# Patient Record
Sex: Female | Born: 1980 | Race: White | Hispanic: No | Marital: Married | Smoking: Never smoker
Health system: Southern US, Community
[De-identification: ages and names within clinical notes are randomized; demographics above are authoritative.]

## PROBLEM LIST (undated history)

## (undated) HISTORY — PX: NO PAST SURGERIES: SHX2092

---

## 2005-01-03 ENCOUNTER — Emergency Department: Payer: Self-pay | Admitting: Unknown Physician Specialty

## 2019-11-01 ENCOUNTER — Ambulatory Visit
Admission: EM | Admit: 2019-11-01 | Discharge: 2019-11-01 | Disposition: A | Discharge status: Home / Self Care | Payer: BC Managed Care – PPO | Attending: Emergency Medicine | Admitting: Emergency Medicine

## 2019-11-01 ENCOUNTER — Other Ambulatory Visit: Payer: Self-pay

## 2019-11-01 ENCOUNTER — Encounter: Payer: Self-pay | Admitting: Emergency Medicine

## 2019-11-01 DIAGNOSIS — J069 Acute upper respiratory infection, unspecified: Secondary | ICD-10-CM | POA: Diagnosis present

## 2019-11-01 DIAGNOSIS — U071 COVID-19: Secondary | ICD-10-CM | POA: Diagnosis not present

## 2019-11-01 DIAGNOSIS — J309 Allergic rhinitis, unspecified: Secondary | ICD-10-CM

## 2019-11-01 LAB — SARS CORONAVIRUS 2 (TAT 6-24 HRS): SARS Coronavirus 2: POSITIVE — AB

## 2019-11-01 NOTE — ED Triage Notes (Signed)
Pt c/o nasal congestion, nausea, cough, subjective fever, sweats. Started about 5 days ago. She has not had covid vaccine.

## 2019-11-01 NOTE — Discharge Instructions (Addendum)
Quarantine until the results of your COVID test are back. If the results are positive then you will need to continue quarantine for 10 days from the start of your symptoms. After 10 days you can break quarantine if your symptoms have improved and you have not had to take medication for fever for 24 hours.  Continue your Allegra and add Mucinex to help break up congestion.  Irrigate your sinuses with a Neilmed sinus rinse kit and distilled water 2-3 times a day.   Hone-based cough syrups are very helpful for relieving cough.  You can use Tylenol or Ibuprofen as needed for fever and pain.  Go to the ER if you develop shortness of breath at rest or if you are unable to take in and keep down fluids.

## 2019-11-01 NOTE — ED Provider Notes (Signed)
[facility]    CSN: [id number] Arrival date & time: 11/01/19  0806      History   Chief Complaint Chief Complaint  Patient presents with   Nasal Congestion    HPI Heidi Harper is a 39 y.o. female.   39 yo female here for evaluation of nasal congestion, cough, ST, subjective fever, sweats, nausea, sibnus pressure and discharge. Symptioms started 5 days ago. ' She is not a smoker, denies know COVID exposure, and is unvaccinated.      History reviewed. No pertinent past medical history.  There are no problems to display for this patient.   Past Surgical History:  Procedure Laterality Date   NO PAST SURGERIES      OB History   No obstetric history on file.      Home Medications    Prior to Admission medications   Medication Sig Start Date End Date Taking? Authorizing Provider  fexofenadine (ALLEGRA) 180 MG tablet Take 180 mg by mouth daily.   Yes [provider]    Family History Family History  Problem Relation Age of Onset   Diabetes Mother    Diabetes Father    Heart disease Father     Social History Social History   Tobacco Use   Smoking status: Never Smoker   Smokeless tobacco: Never Used  Scientific laboratory technician Use: Never used  Substance Use Topics   Alcohol use: Yes   Drug use: Not Currently     Allergies   Patient has no known allergies.   Review of Systems Review of Systems  Constitutional: Positive for diaphoresis and fever. Negative for activity change and appetite change.  HENT: Positive for congestion, ear pain, rhinorrhea, sinus pressure, sinus pain and sore throat. Negative for ear discharge.   Respiratory: Positive for cough. Negative for shortness of breath and wheezing.   Cardiovascular: Negative for chest pain.  Gastrointestinal: Positive for nausea. Negative for diarrhea and vomiting.  Genitourinary: Negative for dysuria and frequency.  Musculoskeletal: Negative for arthralgias and  myalgias.  Skin: Negative.   Allergic/Immunologic: Positive for environmental allergies.  Neurological: Negative for syncope and headaches.  Hematological: Negative.   Psychiatric/Behavioral: Negative.      Physical Exam Triage Vital Signs ED Triage Vitals  Enc Vitals Group     BP 11/01/19 0853 105/82     Pulse Rate 11/01/19 0853 (!) 116     Resp 11/01/19 0853 18     Temp 11/01/19 0853 98.5 F (36.9 C)     Temp Source 11/01/19 0853 Oral     SpO2 11/01/19 0853 98 %     Weight 11/01/19 0849 142 lb (64.4 kg)     Height 11/01/19 0849 5' 5" (1.651 m)     Head Circumference --      Peak Flow --      Pain Score 11/01/19 0849 8     Pain Loc --      Pain Edu? --      Excl. in [place]? --    No data found.  Updated Vital Signs BP 105/82 (BP Location: Left Arm)    Pulse (!) 116    Temp 98.5 F (36.9 C) (Oral)    Resp 18    Ht 5' 5" (1.651 m)    Wt 142 lb (64.4 kg)    LMP 10/16/2019 (Approximate)    SpO2 98%    BMI 23.63 kg/m   Visual Acuity Right Eye Distance:   Left  Eye Distance:   Bilateral Distance:    Right Eye Near:   Left Eye Near:    Bilateral Near:     Physical Exam Vitals and nursing note reviewed.  Constitutional:      Appearance: Normal appearance.  HENT:     Head: Normocephalic and atraumatic.     Right Ear: Tympanic membrane, ear canal and external ear normal.     Left Ear: Tympanic membrane, ear canal and external ear normal.     Ears:     Comments: Mild cerumen in both ears but TM is clearly visible, pearly gray.     Nose: Congestion and rhinorrhea present.     Comments: Nasal mucosa is pale, swollen, with scant clear discharge.   Right frontal and maxillary sinuses are tender to percussion.     Mouth/Throat:     Mouth: Mucous membranes are moist.     Pharynx: No oropharyngeal exudate or posterior oropharyngeal erythema.  Eyes:     Extraocular Movements: Extraocular movements intact.     Conjunctiva/sclera: Conjunctivae normal.     Pupils: Pupils are  equal, round, and reactive to light.  Neck:     Comments: Shotty anterior cervical lymphadenopathy Cardiovascular:     Rate and Rhythm: Normal rate and regular rhythm.     Pulses: Normal pulses.     Heart sounds: Normal heart sounds.  Pulmonary:     Effort: Pulmonary effort is normal.     Breath sounds: Normal breath sounds.  Musculoskeletal:        General: Normal range of motion.     Cervical back: Normal range of motion and neck supple.  Lymphadenopathy:     Cervical: Cervical adenopathy present.  Skin:    General: Skin is warm and dry.     Capillary Refill: Capillary refill takes less than 2 seconds.  Neurological:     General: No focal deficit present.     Mental Status: She is alert.  Psychiatric:        Mood and Affect: Mood normal.        Behavior: Behavior normal.        Thought Content: Thought content normal.        Judgment: Judgment normal.      UC Treatments / Results  Labs (all labs ordered are listed, but only abnormal results are displayed) Labs Reviewed  SARS CORONAVIRUS 2 (TAT 6-24 HRS)    EKG   Radiology No results found.  Procedures Procedures (including critical care time)  Medications Ordered in UC Medications - No data to display  Initial Impression / Assessment and Plan / UC Course  I have reviewed the triage vital signs and the nursing notes.  Pertinent labs & imaging results that were available during my care of the patient were reviewed by me and considered in my medical decision making (see chart for details).   Patient has had 5 days of subjective fever, nasal congestion and clear-yellow nasal discharge. She had ear pressure and ST but those have resolved.  She has a history of allergies but had been out of her medication when symptoms started. She typically takes Human resources officer daily.   PE shows pale, swollen nasal mucosa with clear discharge, no PND or posterior oropharyngeal edema or erythema. Tonsillar pillars are pink and moist  without exudate. Lymphnodes are swollen and mildly tender.  Will check for COVID given unvaccinated status. PE findings more consistent with allergic rhinitis and sinus congestion. Will treat conservatively with fluids, sinus rinse,  continued antihistamines, and mucinex and have quarantine pending COVID results.    Final Clinical Impressions(s) / UC Diagnoses   Final diagnoses:  Allergic rhinitis, unspecified seasonality, unspecified trigger  Upper respiratory tract infection, unspecified type     Discharge Instructions     Quarantine until the results of your COVID test are back. If the results are positive then you will need to continue quarantine for 10 days from the start of your symptoms. After 10 days you can break quarantine if your symptoms have improved and you have not had to take medication for fever for 24 hours.  Continue your Allegra and add Mucinex to help break up congestion.  Irrigate your sinuses with a Neilmed sinus rinse kit and distilled water 2-3 times a day.   Hone-based cough syrups are very helpful for relieving cough.  You can use Tylenol or Ibuprofen as needed for fever and pain.  Go to the ER if you develop shortness of breath at rest or if you are unable to take in and keep down fluids.     ED Prescriptions    None     PDMP not reviewed this encounter.   Margarette Canada, NP 11/01/19 [phone]

## 2020-02-20 ENCOUNTER — Emergency Department: Payer: BC Managed Care – PPO

## 2020-02-20 ENCOUNTER — Other Ambulatory Visit: Payer: Self-pay

## 2020-02-20 ENCOUNTER — Emergency Department
Admission: EM | Admit: 2020-02-20 | Discharge: 2020-02-20 | Disposition: A | Discharge status: Home / Self Care | Payer: BC Managed Care – PPO | Attending: Emergency Medicine | Admitting: Emergency Medicine

## 2020-02-20 DIAGNOSIS — R0789 Other chest pain: Secondary | ICD-10-CM

## 2020-02-20 LAB — CBC
HCT: 30.4 % — ABNORMAL LOW (ref 36.0–46.0)
Hemoglobin: 9 g/dL — ABNORMAL LOW (ref 12.0–15.0)
MCH: 19.8 pg — ABNORMAL LOW (ref 26.0–34.0)
MCHC: 29.6 g/dL — ABNORMAL LOW (ref 30.0–36.0)
MCV: 66.8 fL — ABNORMAL LOW (ref 80.0–100.0)
Platelets: 442 10*3/uL — ABNORMAL HIGH (ref 150–400)
RBC: 4.55 MIL/uL (ref 3.87–5.11)
RDW: 16.6 % — ABNORMAL HIGH (ref 11.5–15.5)
WBC: 13 10*3/uL — ABNORMAL HIGH (ref 4.0–10.5)
nRBC: 0 % (ref 0.0–0.2)

## 2020-02-20 LAB — BASIC METABOLIC PANEL
Anion gap: 10 (ref 5–15)
BUN: 16 mg/dL (ref 6–20)
CO2: 28 mmol/L (ref 22–32)
Calcium: 9.5 mg/dL (ref 8.9–10.3)
Chloride: 102 mmol/L (ref 98–111)
Creatinine, Ser: 0.69 mg/dL (ref 0.44–1.00)
GFR, Estimated: >60 mL/min (ref >60)
Glucose, Bld: 111 mg/dL — ABNORMAL HIGH (ref 70–99)
Potassium: 3.8 mmol/L (ref 3.5–5.1)
Sodium: 140 mmol/L (ref 135–145)

## 2020-02-20 LAB — TROPONIN I (HIGH SENSITIVITY): Troponin I (High Sensitivity): <2 ng/L (ref <18)

## 2020-02-20 MED ORDER — METHOCARBAMOL 500 MG PO TABS: 500.0000 mg | ORAL_TABLET | Freq: Every evening | ORAL | 0 refills | Status: DC | PRN

## 2020-02-20 MED ORDER — MELOXICAM 7.5 MG PO TABS
15.0000 mg | ORAL_TABLET | Freq: Once | ORAL | Status: AC
  Administered 2020-02-20: 15 mg via ORAL
  Filled 2020-02-20: qty 2

## 2020-02-20 MED ORDER — MELOXICAM 15 MG PO TABS: 15.0000 mg | ORAL_TABLET | Freq: Every day | ORAL | 0 refills | Status: DC

## 2020-02-20 NOTE — ED Provider Notes (Signed)
[facility] Emergency Department Provider Note  ____________________________________________  Time seen: Approximately 9:20 PM  I have reviewed the triage vital signs and the nursing notes.   HISTORY  Chief Complaint Chest Pain    HPI Heidi Harper is a 40 y.o. female who presents emergency department complaining of left chest/rib pain.  Patient states that she slipped coming down some stairs last night, had an awkward twisting motion to catch something to keep her from falling.  She did not land on the ribs and did not have any pain immediately following this event but states that today she developed some left chest/rib pain.  Has no cardiac history, denies any shortness of breath.  No other symptoms.  Patient states that it is a pulling/vibrating/tingling/sharp sensation when it occurs.  This pain is primarily localized in the left lateral rib cage         History reviewed. No pertinent past medical history.  There are no problems to display for this patient.   Past Surgical History:  Procedure Laterality Date  . NO PAST SURGERIES      Prior to Admission medications   Medication Sig Start Date End Date Taking? Authorizing Provider  meloxicam (MOBIC) 15 MG tablet Take 1 tablet (15 mg total) by mouth daily. 02/20/20  Yes Nicole Defino, Delorise Royals, PA-C  methocarbamol (ROBAXIN) 500 MG tablet Take 1 tablet (500 mg total) by mouth at bedtime as needed for muscle spasms. 02/20/20  Yes Abreanna Drawdy, Delorise Royals, PA-C  fexofenadine (ALLEGRA) 180 MG tablet Take 180 mg by mouth daily.    [provider]    Allergies Patient has no known allergies.  Family History  Problem Relation Age of Onset  . Diabetes Mother   . Diabetes Father   . Heart disease Father     Social History Social History   Tobacco Use  . Smoking status: Never Smoker  . Smokeless tobacco: Never Used  Vaping Use  . Vaping Use: Never used  Substance Use Topics  . Alcohol use:  Yes  . Drug use: Not Currently     Review of Systems  Constitutional: No fever/chills Eyes: No visual changes. No discharge ENT: No upper respiratory complaints. Cardiovascular: Left chest/rib pain. Respiratory: no cough. No SOB. Gastrointestinal: No abdominal pain.  No nausea, no vomiting.  No diarrhea.  No constipation. Musculoskeletal: Left lateral rib pain Skin: Negative for rash, abrasions, lacerations, ecchymosis. Neurological: Negative for headaches, focal weakness or numbness.  10 System ROS otherwise negative.  ____________________________________________   PHYSICAL EXAM:  VITAL SIGNS: ED Triage Vitals  Enc Vitals Group     BP 02/20/20 1742 130/72     Pulse Rate 02/20/20 1742 99     Resp 02/20/20 1742 18     Temp 02/20/20 1742 98.4 F (36.9 C)     Temp Source 02/20/20 1742 Oral     SpO2 02/20/20 1742 100 %     Weight 02/20/20 1742 152 lb (68.9 kg)     Height 02/20/20 1742 5\' 5"  (1.651 m)     Head Circumference --      Peak Flow --      Pain Score 02/20/20 1841 0     Pain Loc --      Pain Edu? --      Excl. in [place]? --      Constitutional: Alert and oriented. Well appearing and in no acute distress. Eyes: Conjunctivae are normal. PERRL. EOMI. Head: Atraumatic. ENT:  Ears:       Nose: No congestion/rhinnorhea.      Mouth/Throat: Mucous membranes are moist.  Neck: No stridor.    Cardiovascular: Normal rate, regular rhythm. Normal S1 and S2.  Good peripheral circulation. Respiratory: Normal respiratory effort without tachypnea or retractions. Lungs CTAB. Good air entry to the bases with no decreased or absent breath sounds. Gastrointestinal: Bowel sounds 4 quadrants. Soft and nontender to palpation. No guarding or rigidity. No palpable masses. No distention.  Musculoskeletal: Full range of motion to all extremities. No gross deformities appreciated.  Visualization of the left rib cage reveals no visible signs of trauma.  Patient is slightly tender  diffusely along the lateral rib cage without point specific tenderness.  No palpable abnormality or crepitus.  Good underlying breath sounds bilaterally. Neurologic:  Normal speech and language. No gross focal neurologic deficits are appreciated.  Skin:  Skin is warm, dry and intact. No rash noted. Psychiatric: Mood and affect are normal. Speech and behavior are normal. Patient exhibits appropriate insight and judgement.   ____________________________________________   LABS (all labs ordered are listed, but only abnormal results are displayed)  Labs Reviewed  BASIC METABOLIC PANEL - Abnormal; Notable for the following components:      Result Value   Glucose, Bld 111 (*)    All other components within normal limits  CBC - Abnormal; Notable for the following components:   WBC 13.0 (*)    Hemoglobin 9.0 (*)    HCT 30.4 (*)    MCV 66.8 (*)    MCH 19.8 (*)    MCHC 29.6 (*)    RDW 16.6 (*)    Platelets 442 (*)    All other components within normal limits  POC URINE PREG, ED  TROPONIN I (HIGH SENSITIVITY)  TROPONIN I (HIGH SENSITIVITY)   ____________________________________________  EKG  ED ECG REPORT I, Delorise Royals Jaycelynn Knickerbocker,  personally viewed and interpreted this ECG.   Date: 02/20/2020  EKG Time: 1745 hrs.  Rate: 87 bpm  Rhythm: there are no previous tracings available for comparison, normal sinus rhythm  Axis: Normal axis  Intervals:none  ST&T Change: No ST elevation or depression noted  No STEMI.  Normal sinus rhythm.  No previous EKGs on record.  ____________________________________________  RADIOLOGY I personally viewed and evaluated these images as part of my medical decision making, as well as reviewing the written report by the radiologist.  ED Provider Interpretation: No evidence of rib fracture or underlying cardiopulmonary abnormality on chest x-ray  DG Chest 2 View  Result Date: 02/20/2020 CLINICAL DATA:  Left-sided chest pain following fall yesterday,  initial encounter EXAM: CHEST - 2 VIEW COMPARISON:  None. FINDINGS: The heart size and mediastinal contours are within normal limits. Both lungs are clear. The visualized skeletal structures are unremarkable. IMPRESSION: No active cardiopulmonary disease. Electronically Signed   By: Alcide Clever M.D.   On: 02/20/2020 19:41    ____________________________________________    PROCEDURES  Procedure(s) performed:    Procedures    Medications - No data to display   ____________________________________________   INITIAL IMPRESSION / ASSESSMENT AND PLAN / ED COURSE  Pertinent labs & imaging results that were available during my care of the patient were reviewed by me and considered in my medical decision making (see chart for details).  Review of the [place] CSRS was performed in accordance of the [facility] prior to dispensing any controlled drugs.           Patient's diagnosis is consistent with  chest wall pain.  Patient presented to the emergency department complaining of left sided rib/chest pain.  Patient had a fall yesterday, reached out to grab a railing so that she did not further injure herself.  She did not land on the ribs and developed left-sided chest wall pain earlier today.  There was no evident sign of trauma on physical exam.  Work-up is reassuring with no evidence of rib fracture, labs are reassuring.  No evidence that this is cardiac chest pain.  Differential included ACS/STEMI, pneumothorax, rib contusion, rib fracture.  Likely musculoskeletal strain versus costochondral pain.  Patient was placed on anti-inflammatory muscle relaxer for symptom relief..  Follow-up primary care as needed.  Patient is given ED precautions to return to the ED for any worsening or new symptoms.     ____________________________________________  FINAL CLINICAL IMPRESSION(S) / ED DIAGNOSES  Final diagnoses:  Chest wall pain      NEW MEDICATIONS STARTED DURING THIS VISIT:  ED Discharge  Orders         Ordered    meloxicam (MOBIC) 15 MG tablet  Daily        02/20/20 2149    methocarbamol (ROBAXIN) 500 MG tablet  At bedtime PRN        02/20/20 2149              This chart was dictated using voice recognition software/Dragon. Despite best efforts to proofread, errors can occur which can change the meaning. Any change was purely unintentional.    Racheal Patches, PA-C 02/20/20 2151    Sharyn Creamer, MD 02/20/20 2348

## 2020-02-20 NOTE — ED Triage Notes (Signed)
Pt arrives to ER c/o of L sided CP that wraps around pt breast. States she slipped off porch last night. Denies hitting chest but states "I might have pulled something." A&O, ambulatory. No distress noted.

## 2020-03-11 NOTE — Progress Notes (Deleted)
    New patient visit   Patient: Heidi Harper   DOB: 11/30/1980   40 y.o. Female  MRN: [id number] Visit Date: 03/12/2020  Today's healthcare provider: Jairo Ben, FNP   No chief complaint on file.  Subjective    Heidi Harper is a 40 y.o. female who presents today as a new patient to establish care.  HPI    No past medical history on file. Past Surgical History:  Procedure Laterality Date  . NO PAST SURGERIES     Family Status  Relation Name Status  . Mother  Alive  . Father  Alive   Family History  Problem Relation Age of Onset  . Diabetes Mother   . Diabetes Father   . Heart disease Father    Social History   Socioeconomic History  . Marital status: Married    Spouse name: Not on file  . Number of children: Not on file  . Years of education: Not on file  . Highest education level: Not on file  Occupational History  . Not on file  Tobacco Use  . Smoking status: Never Smoker  . Smokeless tobacco: Never Used  Vaping Use  . Vaping Use: Never used  Substance and Sexual Activity  . Alcohol use: Yes  . Drug use: Not Currently  . Sexual activity: Not on file  Other Topics Concern  . Not on file  Social History Narrative  . Not on file   Social Determinants of Health   Financial Resource Strain: Not on file  Food Insecurity: Not on file  Transportation Needs: Not on file  Physical Activity: Not on file  Stress: Not on file  Social Connections: Not on file   Outpatient Medications Prior to Visit  Medication Sig  . fexofenadine (ALLEGRA) 180 MG tablet Take 180 mg by mouth daily.  . meloxicam (MOBIC) 15 MG tablet Take 1 tablet (15 mg total) by mouth daily.  . methocarbamol (ROBAXIN) 500 MG tablet Take 1 tablet (500 mg total) by mouth at bedtime as needed for muscle spasms.   No facility-administered medications prior to visit.   No Known Allergies   There is no immunization history on file for this patient.  Health Maintenance   Topic Date Due  . Hepatitis C Screening  Never done  . COVID-19 Vaccine (1) Never done  . HIV Screening  Never done  . TETANUS/TDAP  Never done  . PAP SMEAR-Modifier  Never done  . INFLUENZA VACCINE  Never done    Patient Care Team: Pcp, No as PCP - General  Review of Systems  {Labs  Heme  Chem  Endocrine  Serology  Results Review (optional):[id number]::" "}  Objective    There were no vitals taken for this visit. Physical Exam ***  Depression Screen No flowsheet data found. No results found for any visits on 03/12/20.  Assessment & Plan     ***  No follow-ups on file.     {provider attestation***:1}   Jairo Ben, FNP  [facility] [phone] (phone) [phone] (fax)  [facility]

## 2020-03-12 ENCOUNTER — Ambulatory Visit: Payer: Self-pay | Admitting: Adult Health

## 2020-03-12 DIAGNOSIS — Z1389 Encounter for screening for other disorder: Secondary | ICD-10-CM

## 2020-04-17 ENCOUNTER — Other Ambulatory Visit: Payer: Self-pay

## 2020-04-17 ENCOUNTER — Ambulatory Visit (INDEPENDENT_AMBULATORY_CARE_PROVIDER_SITE_OTHER): Payer: BC Managed Care – PPO | Admitting: Advanced Practice Midwife

## 2020-04-17 ENCOUNTER — Encounter: Payer: Self-pay | Admitting: Advanced Practice Midwife

## 2020-04-17 VITALS — BP 120/80 | Ht 65.0 in | Wt 155.0 lb

## 2020-04-17 DIAGNOSIS — N924 Excessive bleeding in the premenopausal period: Secondary | ICD-10-CM

## 2020-04-17 NOTE — Patient Instructions (Signed)
Menorrhagia Menorrhagia is a form of abnormal uterine bleeding in which menstrual periods are heavy or last longer than normal. With menorrhagia, the periods may cause enough blood loss and cramping that a woman becomes unable to take part in her usual activities. What are the causes? Common causes of this condition include:  Polyps or fibroids. These are noncancerous growths in the uterus.  An imbalance of the hormones estrogen and progesterone.  Anovulation, which occurs when one of the ovaries does not release an egg during one or more months.  A problem with the thyroid gland (hypothyroidism).  Side effects of having an intrauterine device (IUD).  Side effects of some medicines, such as NSAIDs or blood thinners.  A bleeding disorder that stops the blood from clotting normally. In some cases, the cause of this condition is not known. What increases the risk? You are more likely to develop this condition if you have cancer of the uterus. What are the signs or symptoms? Symptoms of this condition include:  Routinely having to change your pad or tampon every 1-2 hours because it is soaked.  Needing to use pads and tampons at the same time because of heavy bleeding.  Needing to wake up to change your pads or tampons during the night.  Passing blood clots larger than 1 inch (2.5 cm) in size.  Having bleeding that lasts for more than 7 days.  Having symptoms of low iron levels (anemia), such as tiredness (fatigue) or shortness of breath. How is this diagnosed? This condition may be diagnosed based on:  A physical exam.  Your symptoms and menstrual history.  Tests, such as: ? Blood tests to check if you are pregnant or if you have hormonal changes, a bleeding or thyroid disorder, anemia, or other problems. ? Pap test to check for cancerous changes, infections, or inflammation. ? Endometrial biopsy. This test involves removing a tissue sample from the lining of the uterus  (endometrium) to be examined under a microscope. ? Pelvic ultrasound. This test uses sound waves to create images of your uterus, ovaries, and vagina. The images can show if you have fibroids or other growths. ? Hysteroscopy. For this test, a thin, flexible tube with a light on the end (hysteroscope) is used to look inside your uterus. How is this treated? Treatment may not be needed for this condition. If it is needed, the best treatment for you will depend on:  Whether you need to prevent pregnancy.  Your desire to have children in the future.  The cause and severity of your bleeding.  Your personal preference. Medicine Medicines are the first step in treatment. You may be treated with:  Hormonal birth control methods. These treatments reduce bleeding during your menstrual period. They include: ? Birth control pills. ? Skin patch. ? Vaginal ring. ? Shots (injections) that you get every 3 months. ? Hormonal IUD. ? Implants that go under the skin.  Medicines that thicken the blood and slow bleeding.  Medicines that reduce swelling, such as ibuprofen.  Medicines that contain an artificial (synthetic) hormone called progestin.  Medicines that make the ovaries stop working for a short time.  Iron supplements to treat anemia.   Surgery If medicines do not work, surgery may be done. Surgical options may include:  Dilation and curettage (D&C). In this procedure, your health care provider opens the lowest part of the uterus (cervix) and then scrapes or suctions tissue from the endometrium. This reduces menstrual bleeding.  Operative hysteroscopy. In this procedure,   a hysteroscope is used to view your uterus and help remove polyps that may be causing heavy periods.  Endometrial ablation. This is when various techniques are used to permanently destroy your entire endometrium. After endometrial ablation, most women have little or no menstrual flow. This procedure reduces your ability to  become pregnant.  Endometrial resection. In this procedure, an electrosurgical wire loop is used to remove the endometrium. This procedure reduces your ability to become pregnant.  Hysterectomy. This is surgical removal of your uterus. This is a permanent procedure that stops menstrual periods. Pregnancy is not possible after a hysterectomy. Follow these instructions at home: Medicines  Take over-the-counter and prescription medicines only as told by your health care provider. This includes iron pills.  Do not change or switch medicines without asking your health care provider.  Do not take aspirin or medicines that contain aspirin 1 week before or during your menstrual period. Aspirin may make bleeding worse. Managing constipation Your iron pills may cause constipation. If you are taking prescription iron supplements, you may need to take these actions to prevent or treat constipation:  Drink enough fluid to keep your urine pale yellow.  Take over-the-counter or prescription medicines.  Eat foods that are high in fiber, such as beans, whole grains, and fresh fruits and vegetables.  Limit foods that are high in fat and processed sugars, such as fried or sweet foods. General instructions  If you need to change your sanitary pad or tampon more than once every 2 hours, limit your activity until the bleeding stops.  Eat well-balanced meals, including foods that are high in iron. Foods that have a lot of iron include leafy green vegetables, meat, liver, eggs, and whole-grain breads and cereals.  Do not try to lose weight until the abnormal bleeding has stopped and your blood iron level is back to normal. If you need to lose weight, work with your health care provider to lose weight safely.  Keep all follow-up visits. This is important. Contact a health care provider if:  You soak through a pad or tampon every 1 or 2 hours, and this happens every time you have a period.  You need to use  pads and tampons at the same time because you are bleeding so much.  You have nausea, vomiting, diarrhea, or other problems related to medicines you are taking. Get help right away if:  You soak through more than a pad or tampon in 1 hour.  You pass clots bigger than 1 inch (2.5 cm) wide.  You feel short of breath.  You feel like your heart is beating too fast.  You feel dizzy or you faint.  You feel very weak or tired. Summary  Menorrhagia is a form of abnormal uterine bleeding in which menstrual periods are heavy or last longer than normal.  Treatment may not be needed for this condition. If it is needed, it may include medicines or procedures.  Take over-the-counter and prescription medicines only as told by your health care provider. This includes iron pills.  Get help right away if you have heavy bleeding that soaks through more than a pad or tampon in 1 hour, you pass large clots, or you feel dizzy, short of breath, or very weak or tired. This information is not intended to replace advice given to you by your health care provider. Make sure you discuss any questions you have with your health care provider. Document Revised: 10/03/2019 Document Reviewed: 10/03/2019 Elsevier Patient Education  2021   [employer]vier Inc[employer]  

## 2020-04-19 ENCOUNTER — Encounter: Payer: Self-pay | Admitting: Advanced Practice Midwife

## 2020-04-19 NOTE — Progress Notes (Signed)
Patient ID: Heidi Harper, female   DOB: 1980/10/28, 40 y.o.   MRN: [id number]  Reason for Consult: Menorrhagia    Subjective:  Date of Service: 04/17/2020  HPI:  Heidi Harper is a 40 y.o. G1 P72 female being seen for heavier than usual menstrual bleeding. She has had limited health care in the past 10 years. Her periods in that time are every 20 days lasting 7+ days. Up until 4 or 5 years ago they were heavy on just the first 2 days. In the last 4-5 years they start light and then are heavy days 3-5. Her last period started last Thursday as usual and then on day 3 it was heavier than usual with quarter size clots. On Tuesday the flow increased with continued clots she and soaked through all of her garments. The flow slowed down, however she felt weak. The flow again increased that night and she felt lightheaded this morning. This amount of bleeding has happened a few times in past years although she never felt lightheaded due to the bleeding.   Past history includes abortion at age 40, no history of fibroids. She is unsure when her last PAP smear was. She uses "pull out" method for birth control. She and her husband are considering a pregnancy.   We discussed options for diagnosis and treatment. She would like to start with imaging to rule out fibroids. She hesitates to use hormonal treatment.   History reviewed. No pertinent past medical history. Family History  Problem Relation Age of Onset  . Diabetes Mother   . Diabetes Father   . Heart disease Father    Past Surgical History:  Procedure Laterality Date  . NO PAST SURGERIES      Short Social History:  Social History   Tobacco Use  . Smoking status: Never Smoker  . Smokeless tobacco: Never Used  Substance Use Topics  . Alcohol use: Yes    No Known Allergies  Current Outpatient Medications  Medication Sig Dispense Refill  . fexofenadine (ALLEGRA) 180 MG tablet Take 180 mg by mouth daily. (Patient not taking:  Reported on 04/17/2020)    . meloxicam (MOBIC) 15 MG tablet Take 1 tablet (15 mg total) by mouth daily. (Patient not taking: Reported on 04/17/2020) 30 tablet 0  . methocarbamol (ROBAXIN) 500 MG tablet Take 1 tablet (500 mg total) by mouth at bedtime as needed for muscle spasms. (Patient not taking: Reported on 04/17/2020) 14 tablet 0   No current facility-administered medications for this visit.    Review of Systems  Constitutional: Positive for malaise/fatigue. Negative for chills and fever.  HENT: Negative for congestion, ear discharge, ear pain, hearing loss, sinus pain and sore throat.   Eyes: Negative for blurred vision and double vision.  Respiratory: Negative for cough, shortness of breath and wheezing.   Cardiovascular: Negative for chest pain, palpitations and leg swelling.  Gastrointestinal: Positive for constipation and nausea. Negative for abdominal pain, blood in stool, diarrhea, heartburn, melena and vomiting.  Genitourinary: Negative for dysuria, flank pain, frequency, hematuria and urgency.  Musculoskeletal: Negative for back pain, joint pain and myalgias.  Skin: Negative for itching and rash.  Neurological: Positive for dizziness and headaches. Negative for tingling, tremors, sensory change, speech change, focal weakness, seizures, loss of consciousness and weakness.  Endo/Heme/Allergies: Positive for environmental allergies. Does not bruise/bleed easily.       Positive for heavy menstrual bleeding  Psychiatric/Behavioral: Negative for depression, hallucinations, memory loss, substance abuse and suicidal ideas.  The patient is not nervous/anxious and does not have insomnia.        Positive for anxiety       Objective:  Objective   Vitals:   04/17/20 1330  BP: 120/80  Weight: 155 lb (70.3 kg)  Height: 5\' 5"  (1.651 m)   Body mass index is 25.79 kg/m. Constitutional: Well nourished, well developed female in no acute distress.  HEENT: normal Skin: Warm and dry.   Cardiovascular: Regular rate and rhythm.   Extremity: no edema  Respiratory: Clear to auscultation bilateral. Normal respiratory effort Neuro: DTRs 2+, Cranial nerves grossly intact Psych: Alert and Oriented x3. No memory deficits. Normal mood and affect.  MS: normal gait, normal bilateral lower extremity ROM/strength/stability.  Pelvic exam: declined  Total time spent with patient: 20 minutes, greater than 50% in consultation  Assessment/Plan:     40 y.o. G1 P63 female with menorrhagia  Return to clinic in 1 week for gyn ultrasound with follow up Schedule annual exam with PAP   P80 CNM [facility] 04/19/2020, 11:44 AM

## 2020-05-17 ENCOUNTER — Ambulatory Visit: Payer: Self-pay | Admitting: Adult Health

## 2020-06-06 ENCOUNTER — Ambulatory Visit (INDEPENDENT_AMBULATORY_CARE_PROVIDER_SITE_OTHER): Payer: Self-pay | Admitting: Advanced Practice Midwife

## 2020-06-06 ENCOUNTER — Encounter: Payer: Self-pay | Admitting: Advanced Practice Midwife

## 2020-06-06 ENCOUNTER — Other Ambulatory Visit: Payer: Self-pay

## 2020-06-06 VITALS — BP 100/60 | Ht 65.0 in | Wt 152.0 lb

## 2020-06-06 DIAGNOSIS — Z09 Encounter for follow-up examination after completed treatment for conditions other than malignant neoplasm: Secondary | ICD-10-CM

## 2020-06-06 NOTE — Progress Notes (Signed)
Patient ID: Heidi Harper, female   DOB: 02/12/1980, 40 y.o.   MRN: [id number]  Reason for Consult: Follow-up (F/U ultrasound ([facility]) - RM 4)    Subjective:  HPI:  Heidi Harper is a 40 y.o. female being seen for follow up visit after gyn ultrasound. The ultrasound was done yesterday at [facility] facility with report as follows:  Wyn Forster, MD - 06/05/2020  Formatting of this note might be different from the original.  EXAM: [place] ENDOVAGINAL (NON-OB)  DATE: 06/05/2020 3:46 PM  ACCESSION: [id number]  DICTATED: 06/05/2020 3:57 PM  INTERPRETATION LOCATION: [facility]   CLINICAL INDICATION: 40 years old Female with excessive bleeding - R58 - Excessive bleeding    TECHNIQUE: Ultrasound views of the pelvis were obtained endovaginally and transabdominally using gray scale and color Doppler imaging.   COMPARISON: None.   FINDINGS:   UTERUS/CERVIX: The uterus is anteverted. No focal myometrial mass was seen. The endometrium measures 1.6 cm which is within normal limits given the timing of the patient's menstrual cycle. A few scattered echogenic foci at the periphery of the endometrium with posterior shadowing, likely calcifications.   OVARIES: The right ovary was not well seen transvaginally due to overlying bowel gas and transabdominal images were obtained. The left ovary was well visualized transvaginally.Small cystic areas were seen within both ovaries compatible with follicles. Appropriate flow was documented on color Doppler imaging.   OTHER: No abnormal pelvic free fluid.   IMPRESSION:  Unremarkable pelvic ultrasound.     We discussed the results of this normal ultrasound. She is currently on her period and although it is heavy, it is not as bad as the one she had last month. She plans to try a course of NSAIDs to see if that will help decrease the flow. We also discussed hormonal and procedural treatments. She is taking iron supplement.   History reviewed.  No pertinent past medical history. Family History  Problem Relation Age of Onset  . Diabetes Mother   . Diabetes Father   . Heart disease Father    Past Surgical History:  Procedure Laterality Date  . NO PAST SURGERIES      Short Social History:  Social History   Tobacco Use  . Smoking status: Never Smoker  . Smokeless tobacco: Never Used  Substance Use Topics  . Alcohol use: Yes    No Known Allergies  No current outpatient medications on file.   No current facility-administered medications for this visit.   Review of Systems  Constitutional: Positive for malaise/fatigue. Negative for chills and fever.  HENT: Negative for congestion, ear discharge, ear pain, hearing loss, sinus pain and sore throat.   Eyes: Negative for blurred vision and double vision.  Respiratory: Negative for cough, shortness of breath and wheezing.   Cardiovascular: Negative for chest pain, palpitations and leg swelling.  Gastrointestinal: Negative for abdominal pain, blood in stool, constipation, diarrhea, heartburn, melena, nausea and vomiting.  Genitourinary: Negative for dysuria, flank pain, frequency, hematuria and urgency.  Musculoskeletal: Negative for back pain, joint pain and myalgias.  Skin: Negative for itching and rash.  Neurological: Negative for dizziness, tingling, tremors, sensory change, speech change, focal weakness, seizures, loss of consciousness, weakness and headaches.  Endo/Heme/Allergies: Negative for environmental allergies. Does not bruise/bleed easily.       Positive for heavy menstrual bleeding  Psychiatric/Behavioral: Negative for depression, hallucinations, memory loss, substance abuse and suicidal ideas. The patient is not nervous/anxious and does not have insomnia.  Objective:  Objective   Vitals:   06/06/20 1549  BP: 100/60  Weight: 152 lb (68.9 kg)  Height: 5\' 5"  (1.651 m)   Body mass index is 25.29 kg/m. Constitutional: Well nourished, well  developed female in no acute distress.  HEENT: normal Skin: Warm and dry.  Cardiovascular: Regular rate and rhythm.   Extremity: no edema Respiratory: Clear to auscultation bilateral. Normal respiratory effort Neuro: DTRs 2+, Cranial nerves grossly intact Psych: Alert and Oriented x3. No memory deficits. Normal mood and affect.  MS: normal gait, normal bilateral lower extremity ROM/strength/stability.   12 minutes spent in the care of this patient Assessment/Plan:     40 y.o. G1 P67 female with follow up visit for normal gyn ultrasound  Menorrhagia: Ibuprofen 800 mg q 8 hours for 3 heavy days of cycle Return to clinic as needed for worsening symptoms   P80 CNM [facility] 06/06/2020, 4:32 PM

## 2021-01-06 ENCOUNTER — Ambulatory Visit: Payer: 59 | Admitting: Advanced Practice Midwife

## 2021-01-06 ENCOUNTER — Other Ambulatory Visit (HOSPITAL_COMMUNITY)
Admission: RE | Admit: 2021-01-06 | Discharge: 2021-01-06 | Disposition: A | Discharge status: Home / Self Care | Payer: 59 | Source: Ambulatory Visit | Attending: Advanced Practice Midwife | Admitting: Advanced Practice Midwife

## 2021-01-06 ENCOUNTER — Encounter: Payer: Self-pay | Admitting: Advanced Practice Midwife

## 2021-01-06 ENCOUNTER — Other Ambulatory Visit: Payer: Self-pay

## 2021-01-06 VITALS — BP 120/80 | Ht 65.0 in | Wt 162.0 lb

## 2021-01-06 DIAGNOSIS — Z124 Encounter for screening for malignant neoplasm of cervix: Secondary | ICD-10-CM | POA: Diagnosis present

## 2021-01-06 DIAGNOSIS — Z113 Encounter for screening for infections with a predominantly sexual mode of transmission: Secondary | ICD-10-CM

## 2021-01-06 DIAGNOSIS — N946 Dysmenorrhea, unspecified: Secondary | ICD-10-CM

## 2021-01-06 DIAGNOSIS — N924 Excessive bleeding in the premenopausal period: Secondary | ICD-10-CM

## 2021-01-06 DIAGNOSIS — Z1159 Encounter for screening for other viral diseases: Secondary | ICD-10-CM | POA: Diagnosis not present

## 2021-01-06 MED ORDER — NORETHINDRONE 0.35 MG PO TABS: 1.0000 | ORAL_TABLET | Freq: Every day | ORAL | 11 refills | Status: DC

## 2021-01-07 ENCOUNTER — Encounter: Payer: Self-pay | Admitting: Advanced Practice Midwife

## 2021-01-07 LAB — HIV ANTIBODY (ROUTINE TESTING W REFLEX): HIV Screen 4th Generation wRfx: NONREACTIVE

## 2021-01-07 LAB — HEPATITIS B SURFACE ANTIBODY,QUALITATIVE: Hep B Surface Ab, Qual: NONREACTIVE

## 2021-01-07 LAB — HEPATITIS C ANTIBODY: Hep C Virus Ab: <0.1 s/co ratio (ref 0.0–0.9)

## 2021-01-07 LAB — RPR QUALITATIVE: RPR Ser Ql: NONREACTIVE

## 2021-01-07 NOTE — Progress Notes (Addendum)
Patient ID: Heidi Harper, female   DOB: Sep 18, 1980, 40 y.o.   MRN: [id number]  Reason for Consult: Dysmenorrhea   Subjective:  Date of Service: 01/06/2021  HPI:  Heidi Harper is a 40 y.o. female being seen for ongoing heavy, frequent and painful menstrual cycles. At her previous visits in March and May of this year she had declined hormonal cycle control and wanted to try NSAID therapy first. She also had a normal gyn ultrasound. She did try ibuprofen 2 times per day for about 4 days in several of the cycles she had since May. Her periods are every 20 days and they usually last 2 weeks. She changes overnight pads every 2 hours 7 days of the 2 weeks. She reports cramping throughout her cycles with the exception of a "few good days". She and her partner are still considering conception, however, she would now like to try hormonal cycle control to improve quality of life. She also agrees to PAP smear and STD testing.  History reviewed. No pertinent past medical history. Family History  Problem Relation Age of Onset   Diabetes Mother    Diabetes Father    Heart disease Father    Past Surgical History:  Procedure Laterality Date   NO PAST SURGERIES      Short Social History:  Social History   Tobacco Use   Smoking status: Never   Smokeless tobacco: Never  Substance Use Topics   Alcohol use: Yes    No Known Allergies  Current Outpatient Medications  Medication Sig Dispense Refill   norethindrone (MICRONOR) 0.35 MG tablet Take 1 tablet (0.35 mg total) by mouth daily. 30 tablet 11   No current facility-administered medications for this visit.   Review of Systems  Constitutional:  Negative for chills and fever.  HENT:  Negative for congestion, ear discharge, ear pain, hearing loss, sinus pain and sore throat.   Eyes:  Negative for blurred vision and double vision.  Respiratory:  Negative for cough, shortness of breath and wheezing.   Cardiovascular:  Negative for chest  pain, palpitations and leg swelling.  Gastrointestinal:  Negative for abdominal pain, blood in stool, constipation, diarrhea, heartburn, melena, nausea and vomiting.  Genitourinary:  Negative for dysuria, flank pain, frequency, hematuria and urgency.  Musculoskeletal:  Negative for back pain, joint pain and myalgias.  Skin:  Negative for itching and rash.  Neurological:  Negative for dizziness, tingling, tremors, sensory change, speech change, focal weakness, seizures, loss of consciousness, weakness and headaches.  Endo/Heme/Allergies:  Negative for environmental allergies. Does not bruise/bleed easily.       Positive for heavy, frequent and painful periods  Psychiatric/Behavioral:  Negative for depression, hallucinations, memory loss, substance abuse and suicidal ideas. The patient is not nervous/anxious and does not have insomnia.        Objective:  Objective   Vitals:   01/06/21 1515  BP: 120/80  Weight: 162 lb (73.5 kg)  Height: 5\' 5"  (1.651 m)   Body mass index is 26.96 kg/m. Constitutional: Well nourished, well developed female in no acute distress.  HEENT: normal Skin: Warm and dry.  Cardiovascular: Regular rate and rhythm.   Extremity:  no edema   Respiratory: Clear to auscultation bilateral. Normal respiratory effort Abdomen: soft, nontender, nondistended, no abnormal masses, no epigastric pain Neuro: DTRs 2+, Cranial nerves grossly intact Psych: Alert and Oriented x3. No memory deficits. Normal mood and affect.    Pelvic exam:  is not limited by body habitus EGBUS:  within normal limits Vagina: within normal limits and with normal mucosa, scant blood in the vault Cervix: grossly normal appearance   Assessment/Plan:     40 y.o. G1 P71 female with menometrorrhagia  Rx Norethindrone progesterone only pill PAPtima Blood work: HIV, RPR, Hep B, Hep C Follow up as needed   Tresea Mall CNM [facility] 01/07/2021, 12:46 PM

## 2021-01-09 LAB — CYTOLOGY - PAP
Chlamydia: NEGATIVE
Comment: NEGATIVE
Comment: NEGATIVE
Comment: NEGATIVE
Comment: NORMAL
Diagnosis: NEGATIVE
High risk HPV: NEGATIVE
Neisseria Gonorrhea: NEGATIVE
Trichomonas: NEGATIVE

## 2021-01-15 ENCOUNTER — Ambulatory Visit
Admission: EM | Admit: 2021-01-15 | Discharge: 2021-01-15 | Disposition: A | Discharge status: Home / Self Care | Payer: 59 | Attending: Medical Oncology | Admitting: Medical Oncology

## 2021-01-15 ENCOUNTER — Encounter: Payer: Self-pay | Admitting: Emergency Medicine

## 2021-01-15 DIAGNOSIS — R110 Nausea: Secondary | ICD-10-CM | POA: Diagnosis not present

## 2021-01-15 DIAGNOSIS — R0981 Nasal congestion: Secondary | ICD-10-CM | POA: Diagnosis not present

## 2021-01-15 LAB — POCT INFLUENZA A/B
Influenza A, POC: NEGATIVE
Influenza B, POC: NEGATIVE

## 2021-01-15 MED ORDER — FLUTICASONE PROPIONATE 50 MCG/ACT NA SUSP: 2.0000 | Freq: Every day | NASAL | 0 refills | Status: AC

## 2021-01-15 MED ORDER — ONDANSETRON HCL 4 MG PO TABS: 4.0000 mg | ORAL_TABLET | Freq: Four times a day (QID) | ORAL | 0 refills | Status: AC

## 2021-01-15 NOTE — Discharge Instructions (Addendum)
Perform sinus rinse then wait about 10 minutes to let your nose dry. Then use the flonase nasal spray.

## 2021-01-15 NOTE — ED Provider Notes (Signed)
Heidi Harper    CSN: [id number] Arrival date & time: 01/15/21  1208      History   Chief Complaint Chief Complaint  Patient presents with   Headache   Nasal Congestion   Nausea    HPI Heidi Harper is a 40 y.o. female.   HPI  Nasal congestion: Pt reports that she has had sinus congestion, mild sore throat, sinus headache, nausea without vomiting for the past 2-3 days. She denies cough, abdominal pain, vomiting. She has tried nothing yet for symptoms. She states that she knows of people around her who have been sick but has not been around them. LMP: Current.   History reviewed. No pertinent past medical history.  There are no problems to display for this patient.   Past Surgical History:  Procedure Laterality Date   NO PAST SURGERIES      OB History     Gravida  1   Para      Term      Preterm      AB  1   Living         SAB      IAB  1   Ectopic      Multiple      Live Births               Home Medications    Prior to Admission medications   Medication Sig Start Date End Date Taking? Authorizing Provider  norethindrone (MICRONOR) 0.35 MG tablet Take 1 tablet (0.35 mg total) by mouth daily. 01/06/21   Tresea Mall, CNM    Family History Family History  Problem Relation Age of Onset   Diabetes Mother    Diabetes Father    Heart disease Father     Social History Social History   Tobacco Use   Smoking status: Never   Smokeless tobacco: Never  Vaping Use   Vaping Use: Never used  Substance Use Topics   Alcohol use: Yes   Drug use: Not Currently     Allergies   Patient has no known allergies.   Review of Systems Review of Systems  As stated above in HPI Physical Exam Triage Vital Signs ED Triage Vitals  Enc Vitals Group     BP 01/15/21 1250 117/83     Pulse Rate 01/15/21 1250 61     Resp 01/15/21 1250 18     Temp 01/15/21 1250 98.9 F (37.2 C)     Temp Source 01/15/21 1250 Oral     SpO2 01/15/21  1250 98 %     Weight --      Height --      Head Circumference --      Peak Flow --      Pain Score 01/15/21 1252 4     Pain Loc --      Pain Edu? --      Excl. in GC? --    No data found.  Updated Vital Signs BP 117/83    Pulse 61    Temp 98.9 F (37.2 C) (Oral)    Resp 18    LMP 12/20/2020    SpO2 98%   Physical Exam Vitals reviewed.  Constitutional:      General: She is not in acute distress.    Appearance: Normal appearance. She is not ill-appearing, toxic-appearing or diaphoretic.  HENT:     Head: Normocephalic and atraumatic.     Right Ear: Tympanic membrane normal.  Left Ear: Tympanic membrane normal.     Nose: Congestion present.     Mouth/Throat:     Mouth: Mucous membranes are moist.     Pharynx: Oropharynx is clear. No oropharyngeal exudate or posterior oropharyngeal erythema.     Comments: Some clear post nasal drainage  Eyes:     Extraocular Movements: Extraocular movements intact.     Pupils: Pupils are equal, round, and reactive to light.  Cardiovascular:     Rate and Rhythm: Normal rate and regular rhythm.     Heart sounds: Normal heart sounds.  Pulmonary:     Effort: Pulmonary effort is normal.     Breath sounds: Normal breath sounds.  Abdominal:     General: There is no distension.     Palpations: Abdomen is soft.     Tenderness: There is no abdominal tenderness.  Musculoskeletal:     Cervical back: Normal range of motion and neck supple.  Lymphadenopathy:     Cervical: No cervical adenopathy.  Skin:    General: Skin is warm.     Coloration: Skin is not jaundiced.  Neurological:     Mental Status: She is alert.     UC Treatments / Results  Labs (all labs ordered are listed, but only abnormal results are displayed) Labs Reviewed - No data to display  EKG   Radiology No results found.  Procedures Procedures (including critical care time)  Medications Ordered in UC Medications - No data to display  Initial Impression /  Assessment and Plan / UC Course  I have reviewed the triage vital signs and the nursing notes.  Pertinent labs & imaging results that were available during my care of the patient were reviewed by me and considered in my medical decision making (see chart for details).     New. Likely viral in nature. COVID-19 and influenza testing pending. Sending in flonase for her to use with a saline rinse and zofran for nausea. Rest and hydration encouraged. Follow up PRN.  Final Clinical Impressions(s) / UC Diagnoses   Final diagnoses:  None   Discharge Instructions   None    ED Prescriptions   None    PDMP not reviewed this encounter.   Rushie Chestnut, [place] 01/15/21 1332

## 2021-01-15 NOTE — ED Triage Notes (Signed)
Pt here with nasal congestion, nausea, and headache x 2 days.

## 2021-01-16 LAB — COVID-19, FLU A+B NAA
Influenza A, NAA: NOT DETECTED
Influenza B, NAA: NOT DETECTED
SARS-CoV-2, NAA: NOT DETECTED

## 2021-11-07 ENCOUNTER — Other Ambulatory Visit: Payer: Self-pay | Admitting: Advanced Practice Midwife

## 2021-11-07 DIAGNOSIS — N924 Excessive bleeding in the premenopausal period: Secondary | ICD-10-CM

## 2021-11-07 DIAGNOSIS — N946 Dysmenorrhea, unspecified: Secondary | ICD-10-CM

## 2021-12-01 IMAGING — CR DG CHEST 2V
2 series · 2 of 2 positions shown · non-contrast
Comparison: None.

CLINICAL DATA: Left-sided chest pain following fall yesterday,
initial encounter

EXAM:
CHEST - 2 VIEW

[chest pa]
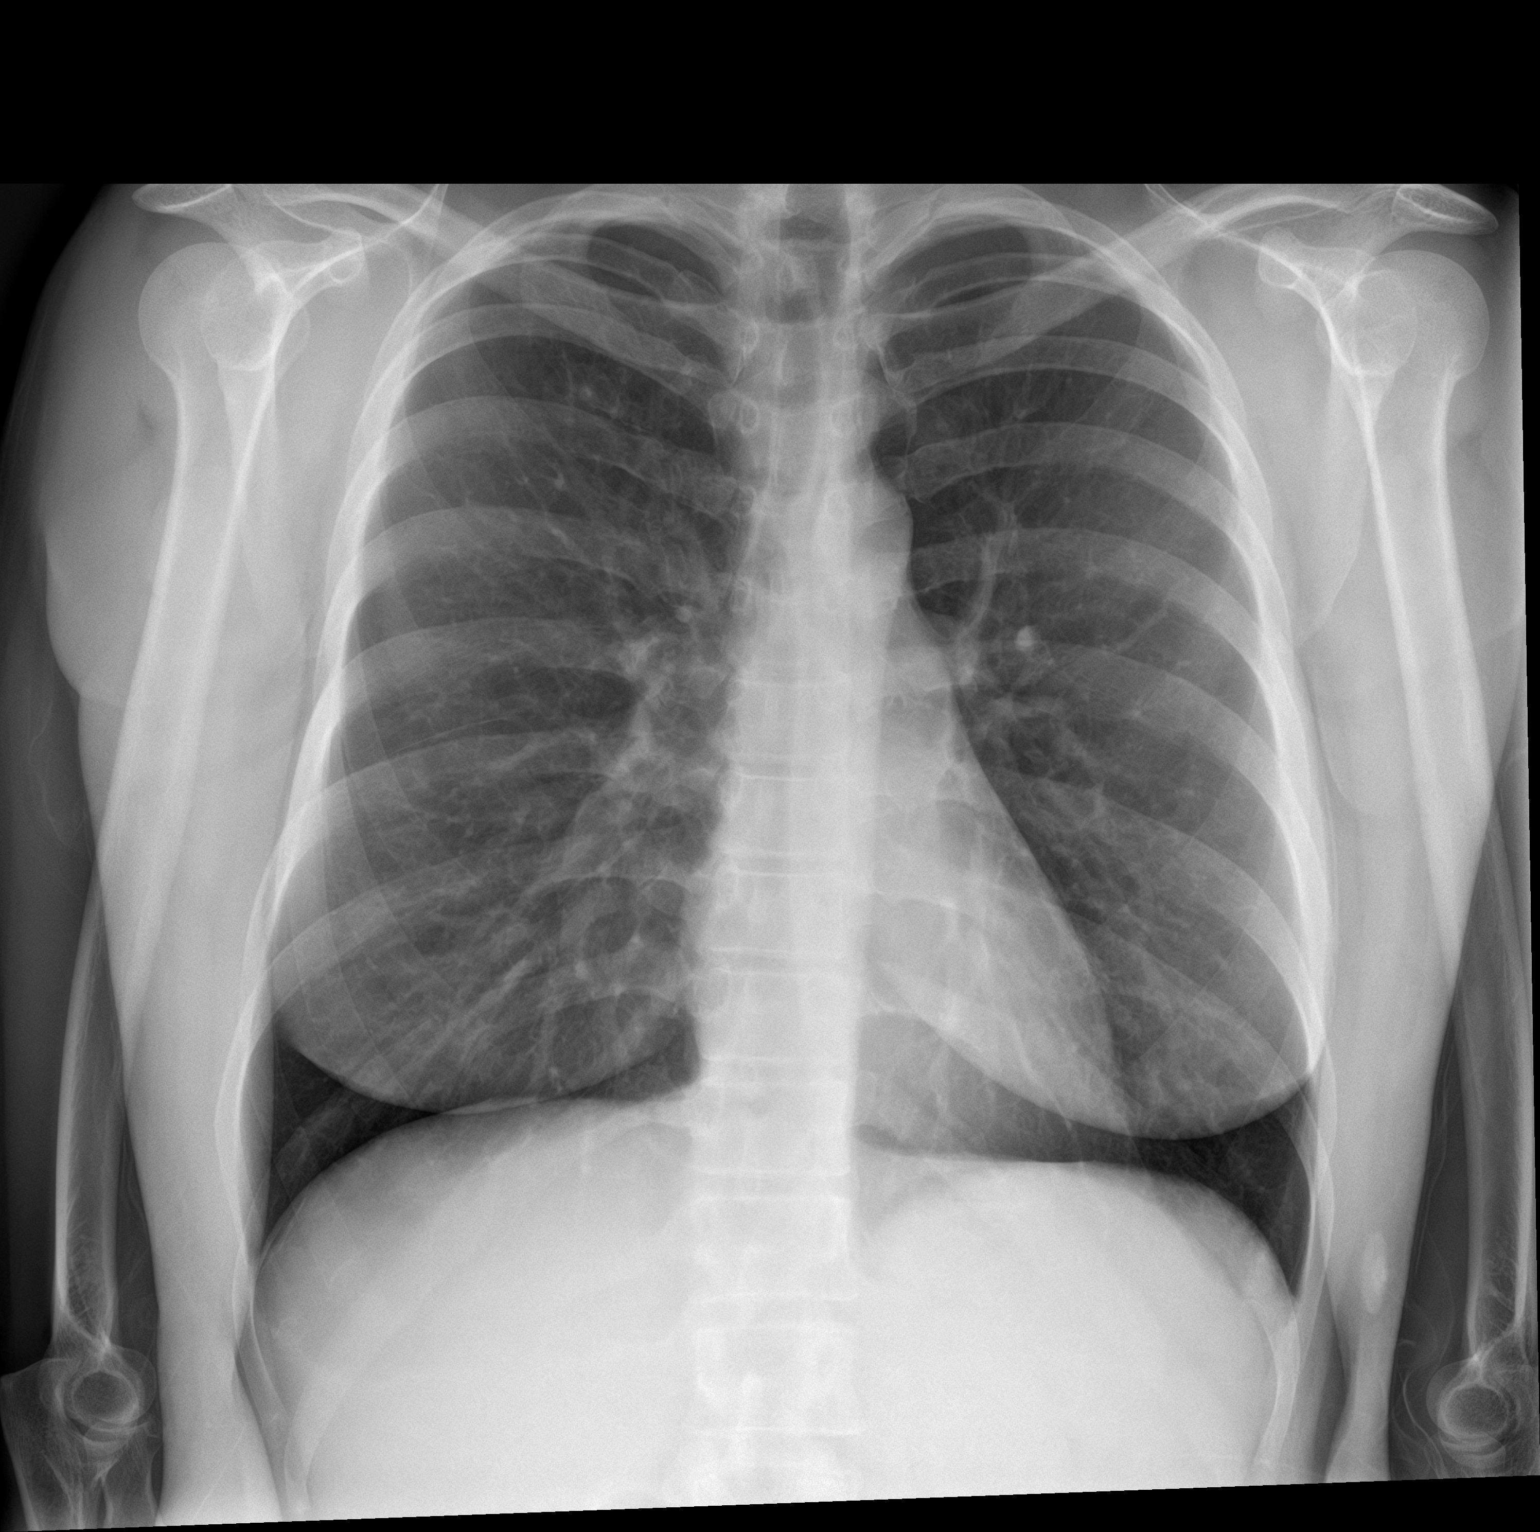

[chest lat]
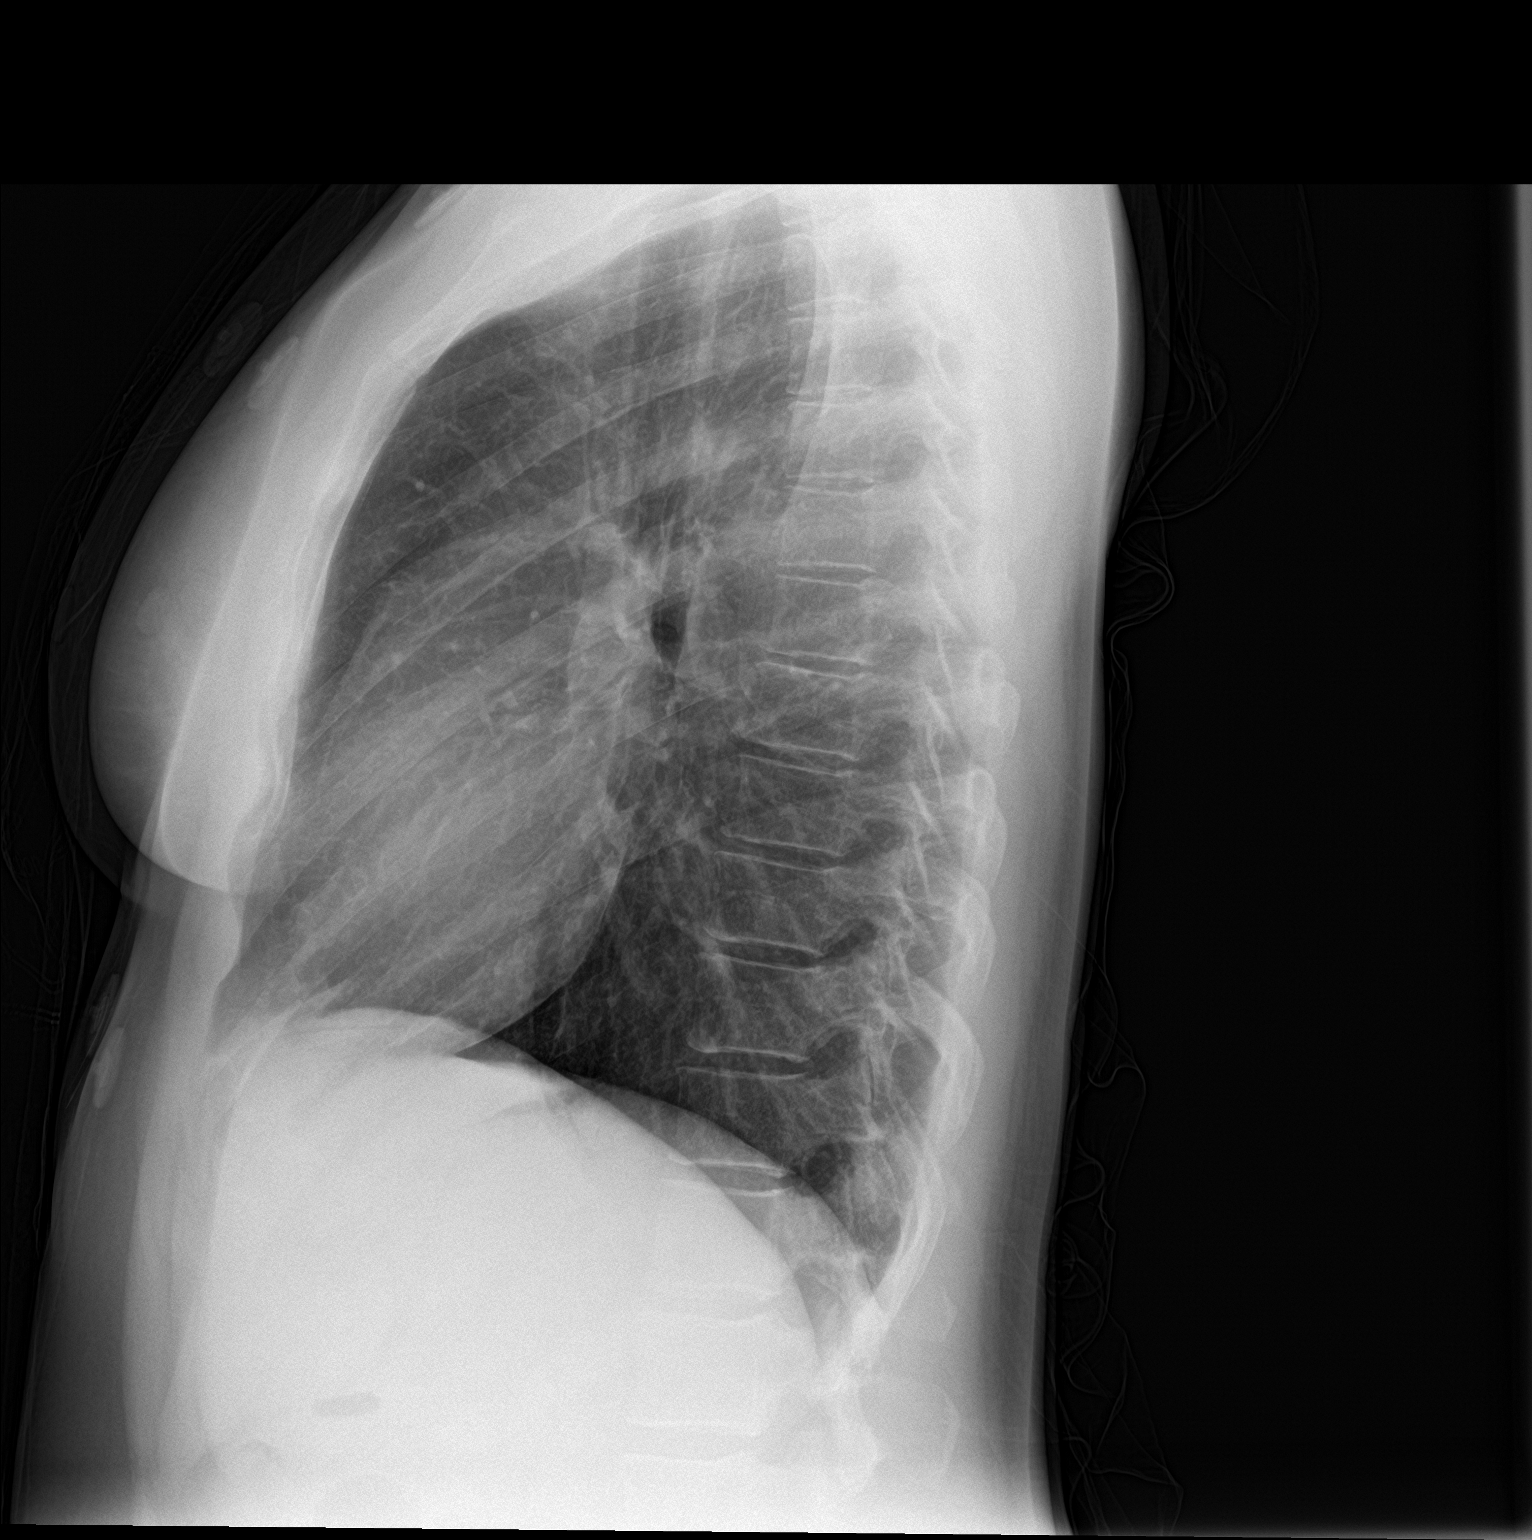

[2 of 2 positions shown; findings below may reference images not displayed]

FINDINGS: The heart size and mediastinal contours are within normal limits.
Both lungs are clear. The visualized skeletal structures are
unremarkable.
IMPRESSION: No active cardiopulmonary disease.
